# Patient Record
Sex: Male | Born: 1995 | Race: White | Hispanic: No | Marital: Single | Smoking: Never smoker
Health system: Southern US, Community
[De-identification: ages and names within clinical notes are randomized; demographics above are authoritative.]

---
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
## 2017-01-22 DIAGNOSIS — Z86018 Personal history of other benign neoplasm: Secondary | ICD-10-CM

## 2017-01-22 HISTORY — DX: Personal history of other benign neoplasm: Z86.018

## 2021-05-02 ENCOUNTER — Other Ambulatory Visit: Payer: Self-pay

## 2021-05-02 ENCOUNTER — Ambulatory Visit (INDEPENDENT_AMBULATORY_CARE_PROVIDER_SITE_OTHER): Payer: 59 | Admitting: Dermatology

## 2021-05-02 ENCOUNTER — Encounter: Payer: Self-pay | Admitting: Dermatology

## 2021-05-02 DIAGNOSIS — D239 Other benign neoplasm of skin, unspecified: Secondary | ICD-10-CM

## 2021-05-02 DIAGNOSIS — L821 Other seborrheic keratosis: Secondary | ICD-10-CM

## 2021-05-02 DIAGNOSIS — Z86018 Personal history of other benign neoplasm: Secondary | ICD-10-CM

## 2021-05-02 DIAGNOSIS — D229 Melanocytic nevi, unspecified: Secondary | ICD-10-CM

## 2021-05-02 DIAGNOSIS — L719 Rosacea, unspecified: Secondary | ICD-10-CM | POA: Diagnosis not present

## 2021-05-02 DIAGNOSIS — D1800 Hemangioma unspecified site: Secondary | ICD-10-CM

## 2021-05-02 DIAGNOSIS — D492 Neoplasm of unspecified behavior of bone, soft tissue, and skin: Secondary | ICD-10-CM

## 2021-05-02 DIAGNOSIS — L578 Other skin changes due to chronic exposure to nonionizing radiation: Secondary | ICD-10-CM

## 2021-05-02 DIAGNOSIS — Z1283 Encounter for screening for malignant neoplasm of skin: Secondary | ICD-10-CM | POA: Diagnosis not present

## 2021-05-02 DIAGNOSIS — B360 Pityriasis versicolor: Secondary | ICD-10-CM

## 2021-05-02 DIAGNOSIS — L858 Other specified epidermal thickening: Secondary | ICD-10-CM

## 2021-05-02 DIAGNOSIS — D225 Melanocytic nevi of trunk: Secondary | ICD-10-CM

## 2021-05-02 DIAGNOSIS — L814 Other melanin hyperpigmentation: Secondary | ICD-10-CM

## 2021-05-02 DIAGNOSIS — R232 Flushing: Secondary | ICD-10-CM

## 2021-05-02 HISTORY — DX: Other benign neoplasm of skin, unspecified: D23.9

## 2021-05-02 MED ORDER — KETOCONAZOLE 2 % EX SHAM: MEDICATED_SHAMPOO | CUTANEOUS | 6 refills | Status: AC

## 2021-05-02 NOTE — Patient Instructions (Addendum)
Melanoma ABCDEs  Melanoma is the most dangerous type of skin cancer, and is the leading cause of death from skin disease.  You are more likely to develop melanoma if you:  Have light-colored skin, light-colored eyes, or red or blond hair  Spend a lot of time in the sun  Tan regularly, either outdoors or in a tanning bed  Have had blistering sunburns, especially during childhood  Have a close family member who has had a melanoma  Have atypical moles or large birthmarks  Early detection of melanoma is key since treatment is typically straightforward and cure rates are extremely high if we catch it early.   The first sign of melanoma is often a change in a mole or a new dark spot.  The ABCDE system is a way of remembering the signs of melanoma.  A for asymmetry:  The two halves do not match. B for border:  The edges of the growth are irregular. C for color:  A mixture of colors are present instead of an even brown color. D for diameter:  Melanomas are usually (but not always) greater than 67mm - the size of a pencil eraser. E for evolution:  The spot keeps changing in size, shape, and color.  Please check your skin once per month between visits. You can use a small mirror in front and a large mirror behind you to keep an eye on the back side or your body.   If you see any new or changing lesions before your next follow-up, please call to schedule a visit.  Please continue daily skin protection including broad spectrum sunscreen SPF 30+ to sun-exposed areas, reapplying every 2 hours as needed when you're outdoors.    If you have any questions or concerns for your doctor, please call our main line at [phone] and press option 4 to reach your doctor's medical assistant. If no one answers, please leave a voicemail as directed and we will return your call as soon as possible. Messages left after 4 pm will be answered the following business day.   You may also send [place] a message via  [place]. We typically respond to MyChart messages within 1-2 business days.  For prescription refills, please ask your pharmacy to contact our office. Our fax number is [phone].  If you have an urgent issue when the clinic is closed that cannot wait until the next business day, you can page your doctor at the number below.    Please note that while we do our best to be available for urgent issues outside of office hours, we are not available 24/7.   If you have an urgent issue and are unable to reach [place], you may choose to seek medical care at your doctor's office, retail clinic, urgent care center, or emergency room.  If you have a medical emergency, please immediately call 911 or go to the emergency department.  Pager Numbers  - Dr. Nehemiah Massed: [phone]  - Dr. Laurence Ferrari: [phone]  - Dr. Nicole Kindred: [phone]  In the event of inclement weather, please call our main line at [phone] for an update on the status of any delays or closures.  Dermatology Medication Tips: Please keep the boxes that topical medications come in in order to help keep track of the instructions about where and how to use these. Pharmacies typically print the medication instructions only on the boxes and not directly on the medication tubes.   If your medication is too expensive, please contact our office  at [phone] option 4 or send [place] a message through [place].   We are unable to tell what your co-pay for medications will be in advance as this is different depending on your insurance coverage. However, we may be able to find a substitute medication at lower cost or fill out paperwork to get insurance to cover a needed medication.   If a prior authorization is required to get your medication covered by your insurance company, please allow [place] 1-2 business days to complete this process.  Drug prices often vary depending on where the prescription is filled and some pharmacies may offer cheaper  prices.  The website www.goodrx.com contains coupons for medications through different pharmacies. The prices here do not account for what the cost may be with help from insurance (it may be cheaper with your insurance), but the website can give you the price if you did not use any insurance.  - You can print the associated coupon and take it with your prescription to the pharmacy.  - You may also stop by our office during regular business hours and pick up a GoodRx coupon card.  - If you need your prescription sent electronically to a different pharmacy, notify our office through [facility] or by phone at [phone] option 4.  Wound Care Instructions  1. Cleanse wound gently with soap and water once a day then pat dry with clean gauze. Apply a thing coat of Petrolatum (petroleum jelly, "Vaseline") over the wound (unless you have an allergy to this). We recommend that you use a new, sterile tube of Vaseline. Do not pick or remove scabs. Do not remove the yellow or white "healing tissue" from the base of the wound.  2. Cover the wound with fresh, clean, nonstick gauze and secure with paper tape. You may use Band-Aids in place of gauze and tape if the would is small enough, but would recommend trimming much of the tape off as there is often too much. Sometimes Band-Aids can irritate the skin.  3. You should call the office for your biopsy report after 1 week if you have not already been contacted.  4. If you experience any problems, such as abnormal amounts of bleeding, swelling, significant bruising, significant pain, or evidence of infection, please call the office immediately.  5. FOR ADULT SURGERY PATIENTS: If you need something for pain relief you may take 1 extra strength Tylenol (acetaminophen) AND 2 Ibuprofen (200mg  each) together every 4 hours as needed for pain. (do not take these if you are allergic to them or if you have a reason you should not take them.) Typically, you may  only need pain medication for 1 to 3 days.

## 2021-05-02 NOTE — Progress Notes (Signed)
Follow-Up Visit   Subjective  Javier Davis is a 25 y.o. male who presents for the following: Annual Exam (Mole check). Hx of Dysplastic nevus.  The patient presents for Total-Body Skin Exam (TBSE) for skin cancer screening and mole check.  The following portions of the chart were reviewed this encounter and updated as appropriate:   Allergies  Meds  Problems  Med Hx  Surg Hx  Fam Hx     Review of Systems:  No other skin or systemic complaints except as noted in HPI or Assessment and Plan.  Objective  Well appearing patient in no apparent distress; mood and affect are within normal limits.  A full examination was performed including scalp, head, eyes, ears, nose, lips, neck, chest, axillae, abdomen, back, buttocks, bilateral upper extremities, bilateral lower extremities, hands, feet, fingers, toes, fingernails, and toenails. All findings within normal limits unless otherwise noted below.  Objective  chest,back: Hypopigmented and hyperpigmented macules   Objective  Head - Anterior (Face): Mid face erythema with telangiectasias +/- scattered inflammatory papules.   Objective  low back spinal: 0.7 cm irregular brown macule    Assessment & Plan  Tinea versicolor chest,back Chronic and persistent and recurrent Start Ketoconazole shampoo to wash from waist up 2-3 times a week  Ordered Medications: ketoconazole (NIZORAL) 2 % shampoo  Rosacea with facial flushing Head - Anterior (Face) Benign-appearing.  Observation.  Call clinic for new or changing lesions.  Recommend daily use of broad spectrum spf 30+ sunscreen to sun-exposed areas.   Neoplasm of skin low back spinal Epidermal / dermal shaving  Lesion diameter (cm):  0.7 Informed consent: discussed and consent obtained   Patient was prepped and draped in usual sterile fashion: area prepped with alcohol. Anesthesia: the lesion was anesthetized in a standard fashion   Anesthetic:  1% lidocaine w/ epinephrine  1-100,000 buffered w/ 8.4% NaHCO3 Instrument used: flexible razor blade   Hemostasis achieved with: pressure, aluminum chloride and electrodesiccation   Outcome: patient tolerated procedure well   Post-procedure details: wound care instructions given   Post-procedure details comment:  Ointment and small bandage applied  Specimen 1 - Surgical pathology Differential Diagnosis: R/O Dysplastic nevus   Check Margins: No 0.7 cm irregular brown macule  Skin cancer screening   Lentigines - Scattered tan macules - Due to sun exposure - Benign-appering, observe - Recommend daily broad spectrum sunscreen SPF 30+ to sun-exposed areas, reapply every 2 hours as needed. - Call for any changes  Seborrheic Keratoses - Stuck-on, waxy, tan-brown papules and/or plaques  - Benign-appearing - Discussed benign etiology and prognosis. - Observe - Call for any changes  Melanocytic Nevi - Tan-brown and/or pink-flesh-colored symmetric macules and papules - Benign appearing on exam today - Observation - Call clinic for new or changing moles - Recommend daily use of broad spectrum spf 30+ sunscreen to sun-exposed areas.   Hemangiomas - Red papules - Discussed benign nature - Observe - Call for any changes  Actinic Damage - Chronic condition, secondary to cumulative UV/sun exposure - diffuse scaly erythematous macules with underlying dyspigmentation - Recommend daily broad spectrum sunscreen SPF 30+ to sun-exposed areas, reapply every 2 hours as needed.  - Staying in the shade or wearing long sleeves, sun glasses (UVA+UVB protection) and wide brim hats (4-inch brim around the entire circumference of the hat) are also recommended for sun protection.  - Call for new or changing lesions.  History of Dysplastic Nevi Multiple see history  - No evidence of recurrence today -  Recommend regular full body skin exams - Recommend daily broad spectrum sunscreen SPF 30+ to sun-exposed areas, reapply every  2 hours as needed.  - Call if any new or changing lesions are noted between office visits  Keratosis Pilaris - Tiny follicular keratotic papules - Benign. Genetic in nature. No cure. - Observe. - If desired, patient can use an emollient (moisturizer) containing ammonium lactate, urea or salicylic acid once a day to smooth the area  Skin cancer screening performed today.  Return in about 1 year (around 05/02/2022) for TBSE, hx of Dysplastic nevus .  IMarye Round, CMA, am acting as scribe for Sarina Ser, MD . Documentation: I have reviewed the above documentation for accuracy and completeness, and I agree with the above.  Sarina Ser, MD

## 2021-05-04 ENCOUNTER — Encounter: Payer: Self-pay | Admitting: Dermatology

## 2021-05-10 ENCOUNTER — Telehealth: Payer: Self-pay

## 2021-05-10 NOTE — Telephone Encounter (Signed)
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

## 2021-05-10 NOTE — Telephone Encounter (Signed)
LM on VM please call here to discuss biopsy results  

## 2021-05-22 ENCOUNTER — Telehealth: Payer: Self-pay

## 2021-05-22 NOTE — Telephone Encounter (Signed)
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

## 2021-05-22 NOTE — Telephone Encounter (Signed)
Discussed biopsy results with pt, return to the office in 3 months for re-shave of Dysplastic nevus

## 2021-05-22 NOTE — Telephone Encounter (Signed)
Left message on voicemail to return my call.  

## 2021-08-23 ENCOUNTER — Ambulatory Visit: Payer: 59 | Admitting: Dermatology

## 2021-09-12 ENCOUNTER — Ambulatory Visit: Payer: 59 | Admitting: Dermatology

## 2021-09-12 ENCOUNTER — Other Ambulatory Visit: Payer: Self-pay

## 2021-09-12 DIAGNOSIS — D239 Other benign neoplasm of skin, unspecified: Secondary | ICD-10-CM

## 2021-09-12 DIAGNOSIS — Z872 Personal history of diseases of the skin and subcutaneous tissue: Secondary | ICD-10-CM

## 2021-09-12 DIAGNOSIS — D229 Melanocytic nevi, unspecified: Secondary | ICD-10-CM | POA: Diagnosis not present

## 2021-09-12 DIAGNOSIS — D225 Melanocytic nevi of trunk: Secondary | ICD-10-CM

## 2021-09-12 DIAGNOSIS — L0591 Pilonidal cyst without abscess: Secondary | ICD-10-CM

## 2021-09-12 NOTE — Progress Notes (Signed)
   Follow-Up Visit   Subjective  Javier Davis is a 25 y.o. male who presents for the following: dysplastic nevus  (Of the low back spinal - bx proven mod to severe patient is here today for shave removal). Patient thinks he may have a pilonidal cyst that flared about 3 days ago. He would like that checked today and to discuss treatment options.   The following portions of the chart were reviewed this encounter and updated as appropriate:   Allergies  Meds  Problems  Med Hx  Surg Hx  Fam Hx     Review of Systems:  No other skin or systemic complaints except as noted in HPI or Assessment and Plan.  Objective  Well appearing patient in no apparent distress; mood and affect are within normal limits.  A focused examination was performed including the trunk. Relevant physical exam findings are noted in the Assessment and Plan.  sacral area Clear today.  low back spinal Re-pigmented brown macule 1.5 cm.   Assessment & Plan  History of recent sore of sacral / superior gluteal crease area - now resolved.  Possible recurrently inflamed small Pilonidal cyst? sacral area Clear. Observe for recurrence. Call clinic for new or changing lesions.  Recommend regular skin exams, daily broad-spectrum spf 30+ sunscreen use, and photoprotection.    Dysplastic nevus low back spinal Bx proven moderate to severely dysplastic nevus   Epidermal / dermal shaving - low back spinal  Lesion diameter (cm):  1.5 Informed consent: discussed and consent obtained   Timeout: patient name, date of birth, surgical site, and procedure verified   Procedure prep:  Patient was prepped and draped in usual sterile fashion Prep type:  Isopropyl alcohol Anesthesia: the lesion was anesthetized in a standard fashion   Anesthetic:  1% lidocaine w/ epinephrine 1-100,000 buffered w/ 8.4% NaHCO3 Instrument used: flexible razor blade   Hemostasis achieved with: pressure, aluminum chloride and electrodesiccation    Outcome: patient tolerated procedure well   Post-procedure details: sterile dressing applied and wound care instructions given   Dressing type: bandage and petrolatum    Specimen 1 - Surgical pathology Differential Diagnosis: D48.5 bx proven mod to severely dysplastic nevus  [id number] Check Margins: No Re-pigmented brown macule 1.5 cm.  Melanocytic Nevi - Tan-brown and/or pink-flesh-colored symmetric macules and papules - Benign appearing on exam today - Observation - Call clinic for new or changing moles - Recommend daily use of broad spectrum spf 30+ sunscreen to sun-exposed areas.   Return in about 1 year (around 09/12/2022) for TBSE - hx dysplastic nevus .  Luther Redo, CMA, am acting as scribe for Sarina Ser, MD .  Documentation: I have reviewed the above documentation for accuracy and completeness, and I agree with the above.  Sarina Ser, MD

## 2021-09-12 NOTE — Patient Instructions (Addendum)
If you have any questions or concerns for your doctor, please call our main line at [phone] and press option 4 to reach your doctor's medical assistant. If no one answers, please leave a voicemail as directed and we will return your call as soon as possible. Messages left after 4 pm will be answered the following business day.   You may also send us a message via MyChart. We typically respond to MyChart messages within 1-2 business days.  For prescription refills, please ask your pharmacy to contact our office. Our fax number is [phone].  If you have an urgent issue when the clinic is closed that cannot wait until the next business day, you can page your doctor at the number below.    Please note that while we do our best to be available for urgent issues outside of office hours, we are not available 24/7.   If you have an urgent issue and are unable to reach us, you may choose to seek medical care at your doctor's office, retail clinic, urgent care center, or emergency room.  If you have a medical emergency, please immediately call 911 or go to the emergency department.  Pager Numbers  - Dr. Kowalski: [phone]  - Dr. Moye: [phone]  - Dr. Stewart: [phone]  In the event of inclement weather, please call our main line at [phone] for an update on the status of any delays or closures.  Dermatology Medication Tips: Please keep the boxes that topical medications come in in order to help keep track of the instructions about where and how to use these. Pharmacies typically print the medication instructions only on the boxes and not directly on the medication tubes.   If your medication is too expensive, please contact our office at [phone] option 4 or send us a message through MyChart.   We are unable to tell what your co-pay for medications will be in advance as this is different depending on your insurance coverage. However, we may be able to find a substitute  medication at lower cost or fill out paperwork to get insurance to cover a needed medication.   If a prior authorization is required to get your medication covered by your insurance company, please allow us 1-2 business days to complete this process.  Drug prices often vary depending on where the prescription is filled and some pharmacies may offer cheaper prices.  The website www.goodrx.com contains coupons for medications through different pharmacies. The prices here do not account for what the cost may be with help from insurance (it may be cheaper with your insurance), but the website can give you the price if you did not use any insurance.  - You can print the associated coupon and take it with your prescription to the pharmacy.  - You may also stop by our office during regular business hours and pick up a GoodRx coupon card.  - If you need your prescription sent electronically to a different pharmacy, notify our office through [place] MyChart or by phone at [phone] option 4.   Wound Care Instructions  Cleanse wound gently with soap and water once a day then pat dry with clean gauze. Apply a thing coat of Petrolatum (petroleum jelly, "Vaseline") over the wound (unless you have an allergy to this). We recommend that you use a new, sterile tube of Vaseline. Do not pick or remove scabs. Do not remove the yellow or white "healing tissue" from the base of the wound.  Cover the   wound with fresh, clean, nonstick gauze and secure with paper tape. You may use Band-Aids in place of gauze and tape if the would is small enough, but would recommend trimming much of the tape off as there is often too much. Sometimes Band-Aids can irritate the skin.  You should call the office for your biopsy report after 1 week if you have not already been contacted.  If you experience any problems, such as abnormal amounts of bleeding, swelling, significant bruising, significant pain, or evidence of infection,  please call the office immediately.  FOR ADULT SURGERY PATIENTS: If you need something for pain relief you may take 1 extra strength Tylenol (acetaminophen) AND 2 Ibuprofen (200mg each) together every 4 hours as needed for pain. (do not take these if you are allergic to them or if you have a reason you should not take them.) Typically, you may only need pain medication for 1 to 3 days.    

## 2021-09-15 ENCOUNTER — Encounter: Payer: Self-pay | Admitting: Dermatology

## 2021-09-19 ENCOUNTER — Telehealth: Payer: Self-pay

## 2021-09-19 NOTE — Telephone Encounter (Signed)
-----   Message from Ralene Bathe, MD sent at 09/14/2021  9:58 AM EDT ----- Diagnosis Skin , low back spinal RECURRENT MELANOCYTIC NEVUS, LIMITED MARGINS FREE  Moderate to severe dysplastic Recurrent Margins clear Recheck next visit Sept 2023

## 2021-09-19 NOTE — Telephone Encounter (Signed)
Patient informed of pathology results 

## 2022-09-11 ENCOUNTER — Ambulatory Visit: Payer: 59 | Admitting: Dermatology

## 2024-06-07 ENCOUNTER — Other Ambulatory Visit: Payer: Self-pay | Admitting: Gastroenterology

## 2024-06-07 ENCOUNTER — Encounter: Payer: Self-pay | Admitting: Gastroenterology

## 2024-06-07 DIAGNOSIS — R61 Generalized hyperhidrosis: Secondary | ICD-10-CM

## 2024-06-07 DIAGNOSIS — R933 Abnormal findings on diagnostic imaging of other parts of digestive tract: Secondary | ICD-10-CM

## 2024-06-07 DIAGNOSIS — K219 Gastro-esophageal reflux disease without esophagitis: Secondary | ICD-10-CM

## 2024-06-11 ENCOUNTER — Ambulatory Visit
Admission: RE | Admit: 2024-06-11 | Discharge: 2024-06-11 | Disposition: A | Discharge status: Home / Self Care | Source: Ambulatory Visit | Attending: Gastroenterology | Admitting: Gastroenterology

## 2024-06-11 DIAGNOSIS — R933 Abnormal findings on diagnostic imaging of other parts of digestive tract: Secondary | ICD-10-CM

## 2024-06-11 DIAGNOSIS — R61 Generalized hyperhidrosis: Secondary | ICD-10-CM

## 2024-06-11 DIAGNOSIS — K219 Gastro-esophageal reflux disease without esophagitis: Secondary | ICD-10-CM

## 2024-06-11 MED ORDER — IOPAMIDOL (ISOVUE-300) INJECTION 61%
100.0000 mL | Freq: Once | INTRAVENOUS | Status: AC | PRN
  Administered 2024-06-11: 100 mL via INTRAVENOUS

## 2024-07-01 ENCOUNTER — Encounter: Payer: Self-pay | Admitting: *Deleted

## 2024-08-05 ENCOUNTER — Encounter: Payer: Self-pay | Admitting: *Deleted

## 2024-08-16 ENCOUNTER — Encounter: Admission: RE | Payer: Self-pay | Source: Home / Self Care

## 2024-08-16 ENCOUNTER — Ambulatory Visit: Admission: RE | Admit: 2024-08-16 | Source: Home / Self Care

## 2024-08-16 SURGERY — EGD (ESOPHAGOGASTRODUODENOSCOPY)
Anesthesia: General
